# Patient Record
Sex: Male | Born: 1950 | Race: White | Hispanic: No | Marital: Married | Smoking: Current every day smoker
Health system: Southern US, Community
[De-identification: ages and names within clinical notes are randomized; demographics above are authoritative.]

## PROBLEM LIST (undated history)

## (undated) DIAGNOSIS — I10 Essential (primary) hypertension: Secondary | ICD-10-CM

---

## 2013-09-05 ENCOUNTER — Encounter (HOSPITAL_COMMUNITY): Payer: Self-pay | Admitting: Emergency Medicine

## 2013-09-05 ENCOUNTER — Emergency Department (HOSPITAL_COMMUNITY): Payer: BC Managed Care – PPO

## 2013-09-05 ENCOUNTER — Emergency Department (HOSPITAL_COMMUNITY)
Admission: EM | Admit: 2013-09-05 | Discharge: 2013-09-05 | Disposition: A | Discharge status: Home / Self Care | Payer: BC Managed Care – PPO | Attending: Emergency Medicine | Admitting: Emergency Medicine

## 2013-09-05 DIAGNOSIS — T671XXA Heat syncope, initial encounter: Secondary | ICD-10-CM

## 2013-09-05 DIAGNOSIS — Z862 Personal history of diseases of the blood and blood-forming organs and certain disorders involving the immune mechanism: Secondary | ICD-10-CM | POA: Insufficient documentation

## 2013-09-05 DIAGNOSIS — Y92838 Other recreation area as the place of occurrence of the external cause: Secondary | ICD-10-CM

## 2013-09-05 DIAGNOSIS — X30XXXA Exposure to excessive natural heat, initial encounter: Secondary | ICD-10-CM | POA: Insufficient documentation

## 2013-09-05 DIAGNOSIS — Y9389 Activity, other specified: Secondary | ICD-10-CM | POA: Insufficient documentation

## 2013-09-05 DIAGNOSIS — I10 Essential (primary) hypertension: Secondary | ICD-10-CM | POA: Insufficient documentation

## 2013-09-05 DIAGNOSIS — F17200 Nicotine dependence, unspecified, uncomplicated: Secondary | ICD-10-CM | POA: Insufficient documentation

## 2013-09-05 DIAGNOSIS — Z79899 Other long term (current) drug therapy: Secondary | ICD-10-CM | POA: Insufficient documentation

## 2013-09-05 DIAGNOSIS — Y9239 Other specified sports and athletic area as the place of occurrence of the external cause: Secondary | ICD-10-CM | POA: Insufficient documentation

## 2013-09-05 DIAGNOSIS — F1021 Alcohol dependence, in remission: Secondary | ICD-10-CM | POA: Insufficient documentation

## 2013-09-05 DIAGNOSIS — Z8639 Personal history of other endocrine, nutritional and metabolic disease: Secondary | ICD-10-CM | POA: Insufficient documentation

## 2013-09-05 DIAGNOSIS — Z792 Long term (current) use of antibiotics: Secondary | ICD-10-CM | POA: Insufficient documentation

## 2013-09-05 HISTORY — DX: Essential (primary) hypertension: I10

## 2013-09-05 LAB — COMPREHENSIVE METABOLIC PANEL
ALT: 33 U/L (ref 0–53)
AST: 29 U/L (ref 0–37)
Albumin: 3.9 g/dL (ref 3.5–5.2)
Alkaline Phosphatase: 112 U/L (ref 39–117)
BILIRUBIN TOTAL: 0.7 mg/dL (ref 0.3–1.2)
BUN: 18 mg/dL (ref 6–23)
CALCIUM: 9 mg/dL (ref 8.4–10.5)
CHLORIDE: 97 meq/L (ref 96–112)
CO2: 23 meq/L (ref 19–32)
Creatinine, Ser: 1.02 mg/dL (ref 0.50–1.35)
GFR, EST AFRICAN AMERICAN: 88 mL/min — AB (ref >90)
GFR, EST NON AFRICAN AMERICAN: 76 mL/min — AB (ref >90)
GLUCOSE: 103 mg/dL — AB (ref 70–99)
Potassium: 3.8 mEq/L (ref 3.7–5.3)
SODIUM: 135 meq/L — AB (ref 137–147)
Total Protein: 7.1 g/dL (ref 6.0–8.3)

## 2013-09-05 LAB — CBC WITH DIFFERENTIAL/PLATELET
Basophils Absolute: 0 10*3/uL (ref 0.0–0.1)
Basophils Relative: 0 % (ref 0–1)
Eosinophils Absolute: 0.1 10*3/uL (ref 0.0–0.7)
Eosinophils Relative: 1 % (ref 0–5)
HEMATOCRIT: 43.9 % (ref 39.0–52.0)
Hemoglobin: 15.8 g/dL (ref 13.0–17.0)
LYMPHS ABS: 2 10*3/uL (ref 0.7–4.0)
LYMPHS PCT: 17 % (ref 12–46)
MCH: 34.3 pg — ABNORMAL HIGH (ref 26.0–34.0)
MCHC: 36 g/dL (ref 30.0–36.0)
MCV: 95.2 fL (ref 78.0–100.0)
MONO ABS: 1.1 10*3/uL — AB (ref 0.1–1.0)
Monocytes Relative: 9 % (ref 3–12)
NEUTROS ABS: 8.3 10*3/uL — AB (ref 1.7–7.7)
Neutrophils Relative %: 73 % (ref 43–77)
Platelets: 240 10*3/uL (ref 150–400)
RBC: 4.61 MIL/uL (ref 4.22–5.81)
RDW: 12.7 % (ref 11.5–15.5)
WBC: 11.4 10*3/uL — AB (ref 4.0–10.5)

## 2013-09-05 LAB — CBG MONITORING, ED: Glucose-Capillary: 96 mg/dL (ref 70–99)

## 2013-09-05 MED ORDER — AZITHROMYCIN 250 MG PO TABS: ORAL_TABLET | ORAL | Status: AC

## 2013-09-05 MED ORDER — SODIUM CHLORIDE 0.9 % IV BOLUS (SEPSIS)
2000.0000 mL | Freq: Once | INTRAVENOUS | Status: AC
  Administered 2013-09-05: 2000 mL via INTRAVENOUS

## 2013-09-05 NOTE — ED Notes (Signed)
Back from xray, alert, NAD, interactive.

## 2013-09-05 NOTE — ED Provider Notes (Signed)
CSN: [id number]     Arrival date & time 09/05/13  1746 History   First MD Initiated Contact with Patient 09/05/13 1747     Chief Complaint  Patient presents with  . Loss of Consciousness     (Consider location/radiation/quality/duration/timing/severity/associated sxs/prior Treatment) Patient is a 63 y.o. male presenting with syncope.  Loss of Consciousness Episode history:  Single Most recent episode:  Today Progression:  Resolved Chronicity:  New Context: dehydration (and being out in heat all day)   Witnessed: yes   Relieved by: raising his feet. Associated symptoms: no anxiety, no chest pain, no confusion, no dizziness, no fever, no focal weakness, no headaches, no nausea, no palpitations, no shortness of breath, no vomiting and no weakness     Past Medical History  Diagnosis Date  . Hypertension    History reviewed. No pertinent past surgical history. History reviewed. No pertinent family history. History  Substance Use Topics  . Smoking status: Current Every Day Smoker  . Smokeless tobacco: Not on file  . Alcohol Use: Yes     Comment: not had any for 12 days beer    Review of Systems  Constitutional: Negative for fever and chills.  HENT: Negative for congestion and rhinorrhea.   Eyes: Negative for pain.  Respiratory: Negative for cough and shortness of breath.   Cardiovascular: Positive for syncope. Negative for chest pain and palpitations.  Gastrointestinal: Negative for nausea, vomiting, abdominal pain, diarrhea and constipation.  Endocrine: Negative for polydipsia and polyuria.  Genitourinary: Negative for dysuria and flank pain.  Musculoskeletal: Negative for back pain and neck pain.  Skin: Negative for color change and wound.  Neurological: Positive for syncope. Negative for dizziness, focal weakness, weakness, numbness and headaches.  Psychiatric/Behavioral: Negative for confusion.      Allergies  Aleve  Home Medications   Prior to Admission  medications   Medication Sig Start Date End Date Taking? Authorizing Provider  albuterol (PROVENTIL HFA;VENTOLIN HFA) 108 (90 BASE) MCG/ACT inhaler Inhale 2 puffs into the lungs every 6 (six) hours as needed for wheezing or shortness of breath.   Yes Historical Provider, MD  amLODipine (NORVASC) 5 MG tablet Take 5 mg by mouth every morning.    Yes Historical Provider, MD  aspirin EC 81 MG tablet Take 81 mg by mouth every morning.   Yes Historical Provider, MD  azithromycin (ZITHROMAX Z-PAK) 250 MG tablet 2 po day one, then 1 daily x 4 days 09/05/13   Marily MemosJason Kamilo Och, MD  cetirizine (ZYRTEC) 10 MG tablet Take 10 mg by mouth daily as needed for allergies.   Yes Historical Provider, MD  hydrochlorothiazide (HYDRODIURIL) 25 MG tablet Take 25 mg by mouth every morning.   Yes Historical Provider, MD  lisinopril (PRINIVIL,ZESTRIL) 20 MG tablet Take 20 mg by mouth every morning.    Yes Historical Provider, MD  nystatin-triamcinolone (MYCOLOG II) cream Apply 1 application topically daily as needed (for rash).   Yes Historical Provider, MD  simvastatin (ZOCOR) 20 MG tablet Take 20 mg by mouth daily at 6 PM.   Yes Historical Provider, MD   BP 133/81  Pulse 95  Temp(Src) 98.3 F (36.8 C) (Oral)  Resp 13  Ht 5' 5.5" (1.664 m)  Wt 211 lb (95.709 kg)  BMI 34.57 kg/m2  SpO2 98% Physical Exam  Nursing note and vitals reviewed. Constitutional: He is oriented to person, place, and time. He appears well-developed and well-nourished.  HENT:  Head: Normocephalic and atraumatic.  Eyes: Conjunctivae and EOM are normal.  Pupils are equal, round, and reactive to light.  Neck: Normal range of motion.  Cardiovascular: Normal rate and regular rhythm.   Pulmonary/Chest: Effort normal and breath sounds normal.  Abdominal: Soft. He exhibits no distension. There is no tenderness.  Musculoskeletal: Normal range of motion. He exhibits no edema and no tenderness.  Neurological: He is alert and oriented to person, place, and  time.  No altered mental status, able to give full seemingly accurate history.  Face is symmetric, EOM's intact, pupils equal and reactive, vision intact, tongue and uvula midline without deviation Upper and Lower extremity motor 5/5, intact pain perception in distal extremities.   Skin: Skin is warm and dry.    ED Course  Procedures (including critical care time) Labs Review Labs Reviewed  CBC WITH DIFFERENTIAL - Abnormal; Notable for the following:    WBC 11.4 (*)    MCH 34.3 (*)    Neutro Abs 8.3 (*)    Monocytes Absolute 1.1 (*)    All other components within normal limits  COMPREHENSIVE METABOLIC PANEL - Abnormal; Notable for the following:    Sodium 135 (*)    Glucose, Bld 103 (*)    GFR calc non Af Amer 76 (*)    GFR calc Af Amer 88 (*)    All other components within normal limits  CBG MONITORING, ED    Imaging Review Dg Chest 2 View  09/05/2013   CLINICAL DATA:  Loss of  consciousness  EXAM: CHEST  2 VIEW  COMPARISON:  None  FINDINGS: Normal cardiac silhouette. There is fine airspace disease in the lingula. No pleural fluid. No pulmonary No pneumothorax.  IMPRESSION: Fine airspace disease in the lingula representing mild edema or early pneumonia.   Electronically Signed   By: Genevive BiStewart  Edmunds M.D.   On: 09/05/2013 19:24     EKG Interpretation   Date/Time:  Saturday September 05 2013 20:12:22 EDT Ventricular Rate:  97 PR Interval:  147 QRS Duration: 93 QT Interval:  358 QTC Calculation: 455 R Axis:   -21 Text Interpretation:  Sinus rhythm Borderline left axis deviation Similar  to previous Confirmed by Gwendolyn GrantWALDEN  MD, BLAIR ([id number]) on 09/05/2013 8:17:11 PM      MDM   Final diagnoses:  Heat causing syncope    63 yo M w/o significant PMH aside from hemochromatosis and alcoholism (currently sober for 12 days) and HTN here after syncopal episode. Was sitting outside in heat, sweating a lot then started 'going in an dout of consciousness' per bystanders and was pale. Then  patient had full syncopal episode. Physician bystander lifted his legs and lied him down and he subsequently came to. No associated symptoms. Here tachycardic, erythema on skin, dehydrated on exam.  Doubt cardiac or neurologic cause, fel likely to be heat syncope. Will check labs for evidence of heat stroke/exhaustion and if all normal will repeat neuro exam and ensure no more syncopal episodes and likely d/c. Fluids PO and IV in mean time. Neuro exam still fine. Repeat ecg without evidence of block. Will give z pack he can take for possible pneumonia on xr. Will fu w/ pcp in 3 days (already scheduled). Doubt cardiac or hemochromatosis related causes of his syncope. Will continue to fluid hydrate at home (HR improved here with IV and PO fluids)   Marily MemosJason Durenda Pechacek, MD 09/06/13 [phone]

## 2013-09-05 NOTE — ED Notes (Signed)
Team d/c by Dr. Clayborne DanaMesner and this RN. No changes, alert, NAD, calm. VSS/improved.

## 2013-09-05 NOTE — ED Notes (Signed)
Pt presents to ED via EMS. Pt was at football game when he had a syncopal episode.

## 2013-09-05 NOTE — ED Notes (Signed)
Introduced self to family. Pt in xray. Family reports pt did not mention recent: "issues with ear wax, has been trying to clean out ears with debrox and Qtips, not sure if this is associated with nausea, dizziness and altered feeling".

## 2013-09-05 NOTE — ED Notes (Signed)
Pt being transported out of the department for a procedure; family at bedside

## 2013-09-05 NOTE — ED Notes (Signed)
EKG repeated, given to Drs. Mesner and CitigroupWalden.

## 2013-09-05 NOTE — ED Notes (Signed)
IVF infusing w.o. to gravity. Alert, NAD, calm, jovial, pleasant, interactive. Denies sx or complaints. (Denies: recent cough, congestion, cold sx, nvd or fever). VSS. HR decreasing improving, HR 103.

## 2013-09-05 NOTE — ED Notes (Signed)
Pt given 2 cups of ice water; ok'd by Aggie Cosierrystal, MD; family at bedside

## 2013-09-05 NOTE — ED Notes (Signed)
POCT CBG resulted 96

## 2013-09-05 NOTE — ED Notes (Signed)
Report given to Robert, EMT 

## 2013-09-06 NOTE — ED Provider Notes (Signed)
I saw and evaluated the patient, reviewed the resident's note and I agree with the findings and plan.   EKG Interpretation   Date/Time:  Saturday September 05 2013 20:12:22 EDT Ventricular Rate:  97 PR Interval:  147 QRS Duration: 93 QT Interval:  358 QTC Calculation: 455 R Axis:   -21 Text Interpretation:  Sinus rhythm Borderline left axis deviation Similar  to previous Confirmed by Gwendolyn GrantWALDEN  MD, BLAIR ([id number]) on 09/05/2013 8:17:11 PM      Here with syncope - passed out at a race - was in the hot sun. No seizure. Hx of alcoholism, is recovering, no alcohol today. EKG ok, labs ok. No preceding symptoms. Can f/u with PCP.  Dagmar HaitWilliam Blair Walden, MD 09/06/13 [phone]

## 2015-07-02 IMAGING — CR DG CHEST 2V
2 series · 2 of 2 positions shown · non-contrast
Comparison: None

CLINICAL DATA: Loss of  consciousness

EXAM:
CHEST  2 VIEW

[w chest pa]
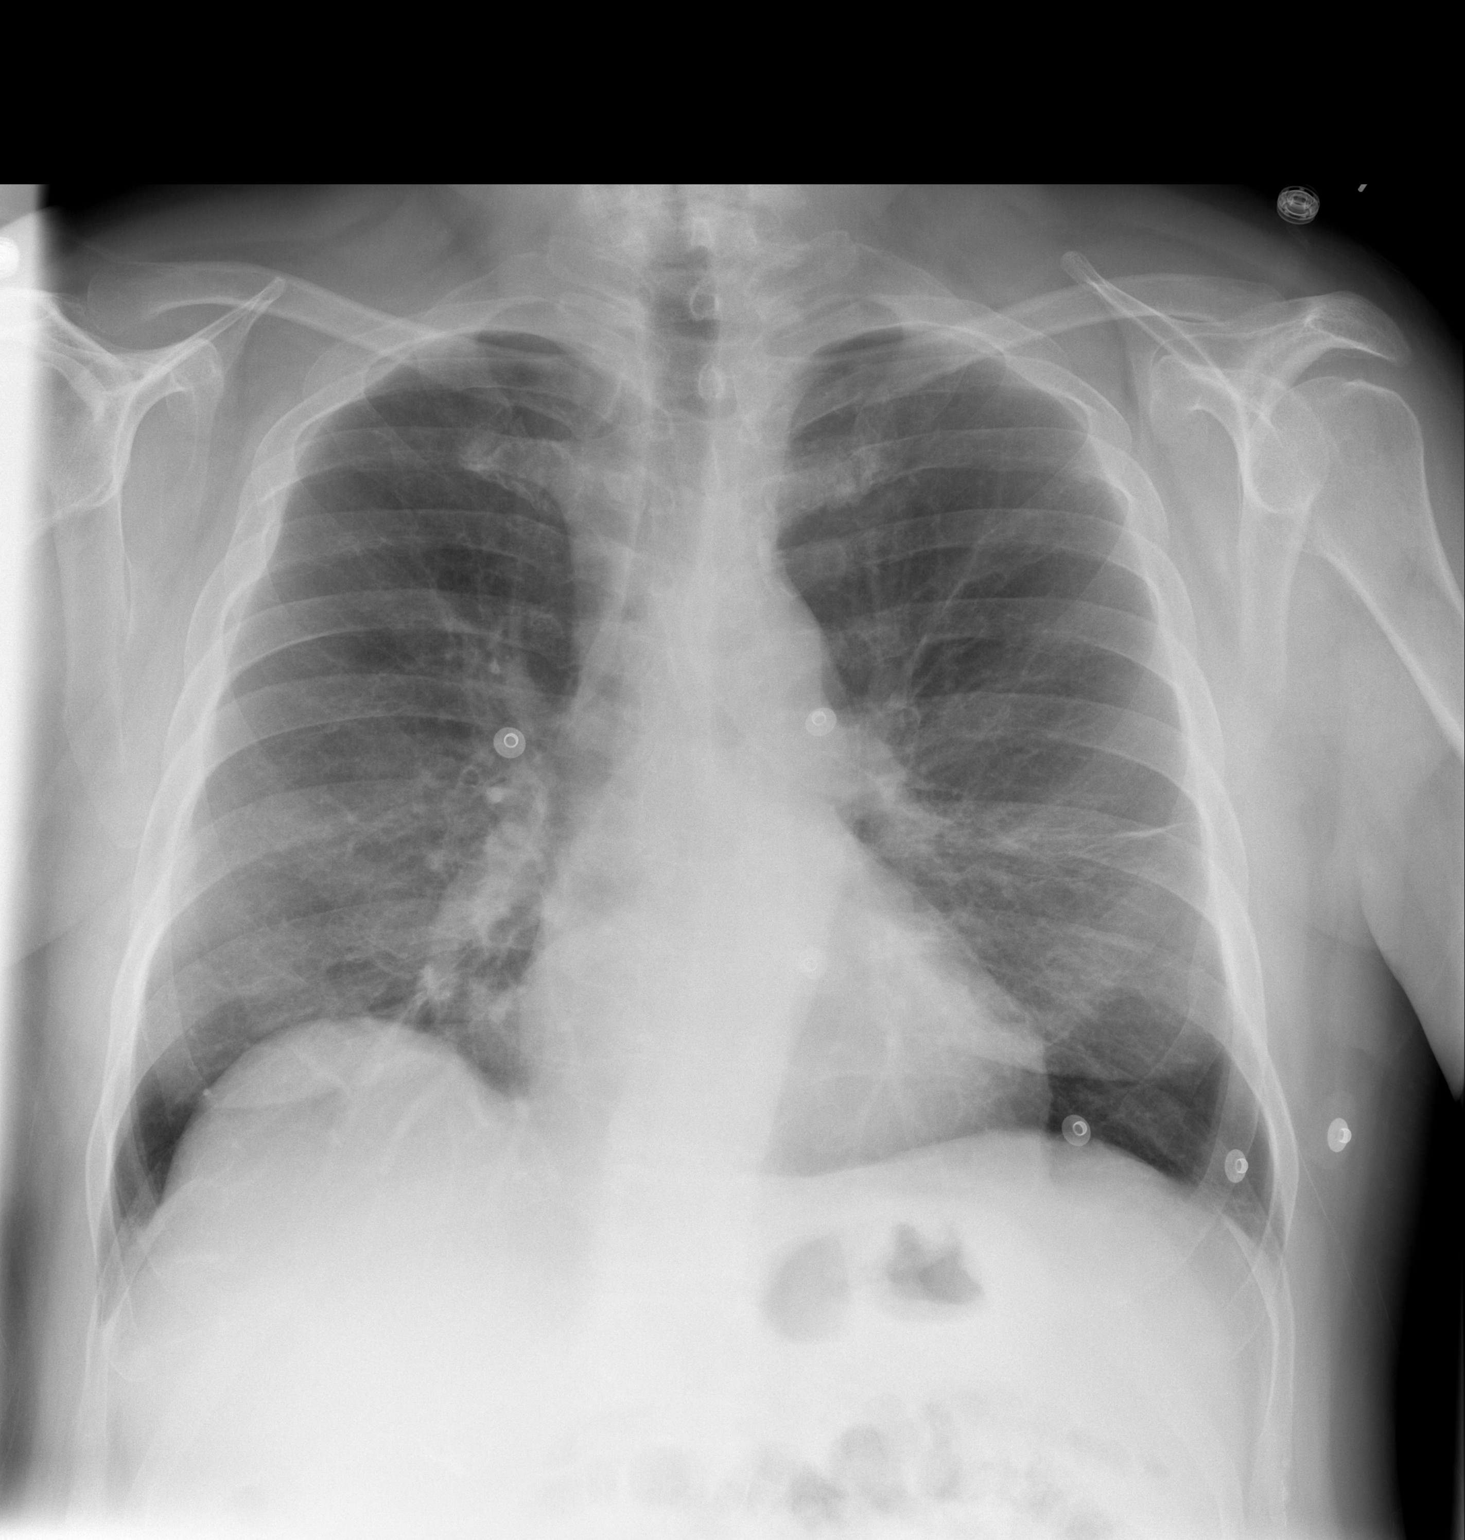

[w chest lat]
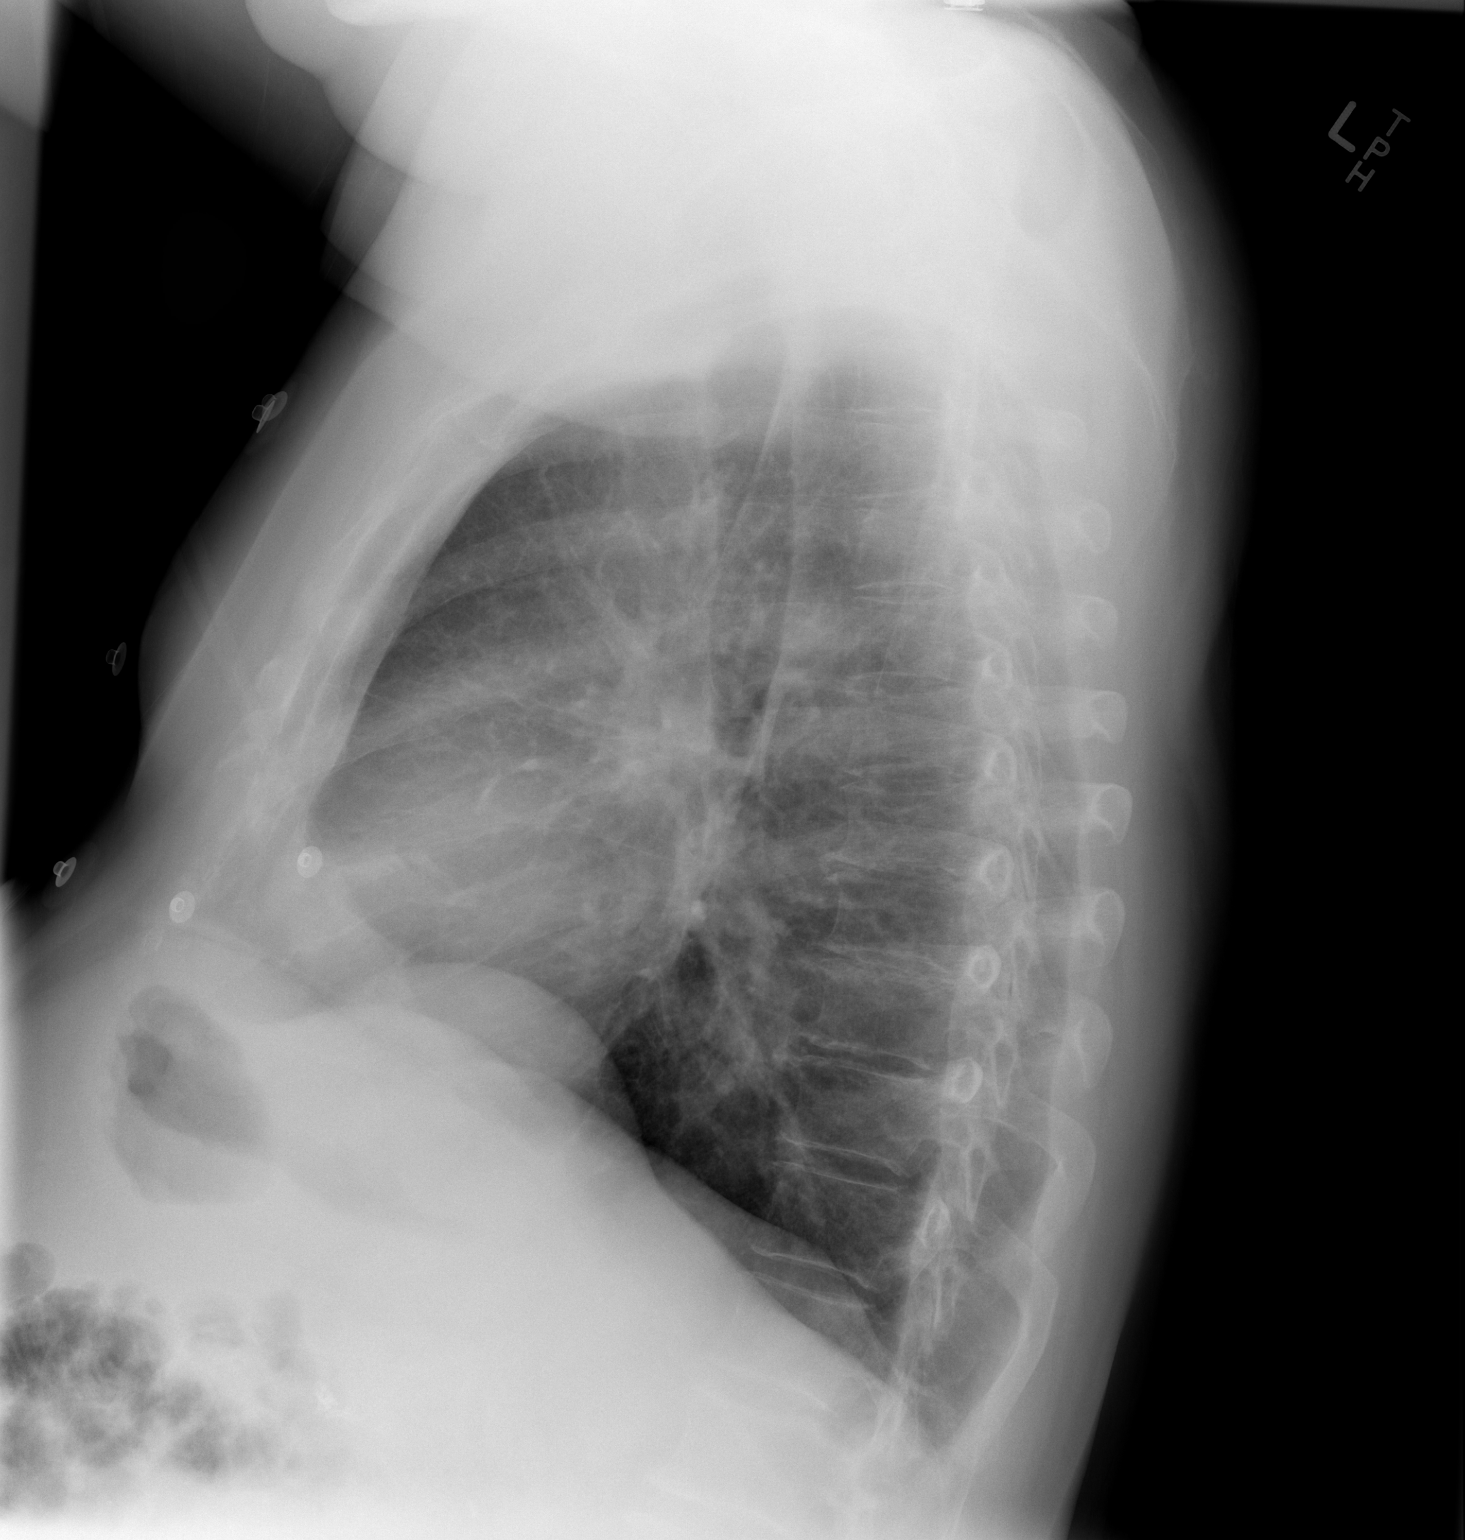

[2 of 2 positions shown; findings below may reference images not displayed]

FINDINGS: Normal cardiac silhouette. There is fine airspace disease in the
lingula. No pleural fluid. No pulmonary No pneumothorax.
IMPRESSION: Fine airspace disease in the lingula representing mild edema or
early pneumonia.
